# Patient Record
Sex: Male | Born: 1980 | Race: Black or African American | Hispanic: No | Marital: Married | Smoking: Current some day smoker
Health system: Southern US, Community
[De-identification: ages and names within clinical notes are randomized; demographics above are authoritative.]

---

## 2000-01-29 ENCOUNTER — Emergency Department (HOSPITAL_COMMUNITY): Admission: EM | Admit: 2000-01-29 | Discharge: 2000-01-29 | Payer: Self-pay

## 2000-08-30 ENCOUNTER — Encounter: Payer: Self-pay | Admitting: Emergency Medicine

## 2000-08-30 ENCOUNTER — Emergency Department (HOSPITAL_COMMUNITY): Admission: EM | Admit: 2000-08-30 | Discharge: 2000-08-30 | Payer: Self-pay | Admitting: Emergency Medicine

## 2000-09-09 ENCOUNTER — Emergency Department (HOSPITAL_COMMUNITY): Admission: EM | Admit: 2000-09-09 | Discharge: 2000-09-09 | Payer: Self-pay | Admitting: Emergency Medicine

## 2002-07-09 ENCOUNTER — Emergency Department (HOSPITAL_COMMUNITY): Admission: AD | Admit: 2002-07-09 | Discharge: 2002-07-09 | Payer: Self-pay | Admitting: Emergency Medicine

## 2002-07-09 ENCOUNTER — Emergency Department (HOSPITAL_COMMUNITY): Admission: EM | Admit: 2002-07-09 | Discharge: 2002-07-09 | Payer: Self-pay

## 2002-07-16 ENCOUNTER — Emergency Department (HOSPITAL_COMMUNITY): Admission: EM | Admit: 2002-07-16 | Discharge: 2002-07-16 | Payer: Self-pay | Admitting: Emergency Medicine

## 2002-08-03 ENCOUNTER — Emergency Department (HOSPITAL_COMMUNITY): Admission: EM | Admit: 2002-08-03 | Discharge: 2002-08-03 | Payer: Self-pay | Admitting: Emergency Medicine

## 2003-05-27 ENCOUNTER — Emergency Department (HOSPITAL_COMMUNITY): Admission: EM | Admit: 2003-05-27 | Discharge: 2003-05-27 | Payer: Self-pay | Admitting: Emergency Medicine

## 2003-06-01 ENCOUNTER — Emergency Department (HOSPITAL_COMMUNITY): Admission: EM | Admit: 2003-06-01 | Discharge: 2003-06-01 | Payer: Self-pay | Admitting: Family Medicine

## 2003-06-23 ENCOUNTER — Emergency Department (HOSPITAL_COMMUNITY): Admission: EM | Admit: 2003-06-23 | Discharge: 2003-06-23 | Payer: Self-pay | Admitting: Family Medicine

## 2004-06-01 ENCOUNTER — Emergency Department (HOSPITAL_COMMUNITY): Admission: EM | Admit: 2004-06-01 | Discharge: 2004-06-01 | Payer: Self-pay | Admitting: Emergency Medicine

## 2004-06-11 ENCOUNTER — Emergency Department (HOSPITAL_COMMUNITY): Admission: EM | Admit: 2004-06-11 | Discharge: 2004-06-11 | Payer: Self-pay | Admitting: Emergency Medicine

## 2004-11-30 ENCOUNTER — Emergency Department (HOSPITAL_COMMUNITY): Admission: EM | Admit: 2004-11-30 | Discharge: 2004-11-30 | Payer: Self-pay | Admitting: Emergency Medicine

## 2004-12-02 ENCOUNTER — Emergency Department (HOSPITAL_COMMUNITY): Admission: EM | Admit: 2004-12-02 | Discharge: 2004-12-02 | Payer: Self-pay | Admitting: Family Medicine

## 2004-12-04 ENCOUNTER — Emergency Department (HOSPITAL_COMMUNITY): Admission: EM | Admit: 2004-12-04 | Discharge: 2004-12-04 | Payer: Self-pay | Admitting: Family Medicine

## 2004-12-07 ENCOUNTER — Emergency Department (HOSPITAL_COMMUNITY): Admission: EM | Admit: 2004-12-07 | Discharge: 2004-12-07 | Payer: Self-pay | Admitting: Family Medicine

## 2005-07-29 ENCOUNTER — Emergency Department (HOSPITAL_COMMUNITY): Admission: EM | Admit: 2005-07-29 | Discharge: 2005-07-29 | Payer: Self-pay | Admitting: Family Medicine

## 2006-09-20 ENCOUNTER — Emergency Department (HOSPITAL_COMMUNITY): Admission: EM | Admit: 2006-09-20 | Discharge: 2006-09-20 | Payer: Self-pay | Admitting: Emergency Medicine

## 2007-07-19 ENCOUNTER — Emergency Department (HOSPITAL_COMMUNITY): Admission: EM | Admit: 2007-07-19 | Discharge: 2007-07-19 | Payer: Self-pay | Admitting: Family Medicine
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
## 2009-09-28 ENCOUNTER — Emergency Department (HOSPITAL_COMMUNITY): Admission: EM | Admit: 2009-09-28 | Discharge: 2009-09-28 | Payer: Self-pay | Admitting: Emergency Medicine

## 2010-10-04 LAB — URINE CULTURE
Colony Count: NO GROWTH
Culture: NO GROWTH

## 2010-10-04 LAB — HERPES SIMPLEX VIRUS CULTURE: Culture: DETECTED

## 2010-10-04 LAB — HEPATITIS B CORE ANTIBODY, TOTAL: Hep B Core Total Ab: NEGATIVE

## 2010-10-04 LAB — POCT URINALYSIS DIP (DEVICE)
Bilirubin Urine: NEGATIVE
Glucose, UA: NEGATIVE
Ketones, ur: NEGATIVE
Nitrite: NEGATIVE
Specific Gravity, Urine: 1.02
pH: 7

## 2010-10-04 LAB — HEPATITIS B SURFACE ANTIGEN: Hepatitis B Surface Ag: NEGATIVE

## 2010-10-04 LAB — GC/CHLAMYDIA PROBE AMP, GENITAL: Chlamydia, DNA Probe: NEGATIVE

## 2018-07-12 ENCOUNTER — Other Ambulatory Visit: Payer: Self-pay

## 2018-07-12 DIAGNOSIS — M5431 Sciatica, right side: Secondary | ICD-10-CM | POA: Diagnosis not present

## 2018-07-12 DIAGNOSIS — F17290 Nicotine dependence, other tobacco product, uncomplicated: Secondary | ICD-10-CM | POA: Insufficient documentation

## 2018-07-12 DIAGNOSIS — M545 Low back pain: Secondary | ICD-10-CM | POA: Diagnosis present

## 2018-07-13 ENCOUNTER — Emergency Department (HOSPITAL_COMMUNITY)
Admission: EM | Admit: 2018-07-13 | Discharge: 2018-07-13 | Disposition: A | Discharge status: Home / Self Care | Payer: BC Managed Care – PPO | Attending: Emergency Medicine | Admitting: Emergency Medicine

## 2018-07-13 ENCOUNTER — Encounter (HOSPITAL_COMMUNITY): Payer: Self-pay | Admitting: Emergency Medicine

## 2018-07-13 ENCOUNTER — Other Ambulatory Visit: Payer: Self-pay

## 2018-07-13 DIAGNOSIS — M5431 Sciatica, right side: Secondary | ICD-10-CM

## 2018-07-13 MED ORDER — METHOCARBAMOL 500 MG PO TABS
500.0000 mg | ORAL_TABLET | Freq: Once | ORAL | Status: AC
  Administered 2018-07-13: 500 mg via ORAL
  Filled 2018-07-13: qty 1

## 2018-07-13 MED ORDER — IBUPROFEN 800 MG PO TABS
800.0000 mg | ORAL_TABLET | Freq: Once | ORAL | Status: AC
  Administered 2018-07-13: 04:00:00 800 mg via ORAL
  Filled 2018-07-13: qty 1

## 2018-07-13 MED ORDER — IBUPROFEN 800 MG PO TABS: 800.0000 mg | ORAL_TABLET | Freq: Three times a day (TID) | ORAL | 0 refills | Status: DC | PRN

## 2018-07-13 MED ORDER — PREDNISONE 20 MG PO TABS
60.0000 mg | ORAL_TABLET | Freq: Once | ORAL | Status: AC
  Administered 2018-07-13: 04:00:00 60 mg via ORAL
  Filled 2018-07-13: qty 3

## 2018-07-13 MED ORDER — PREDNISONE 10 MG (21) PO TBPK: ORAL_TABLET | ORAL | 0 refills | Status: DC

## 2018-07-13 MED ORDER — METHOCARBAMOL 500 MG PO TABS: 500.0000 mg | ORAL_TABLET | Freq: Three times a day (TID) | ORAL | 0 refills | Status: DC | PRN
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
## 2018-07-13 NOTE — Discharge Instructions (Addendum)
You may alternate Tylenol 1000 mg every 6 hours as needed for pain and Ibuprofen 800 mg every 8 hours as needed for pain.  Please take Ibuprofen with food. ° °Steps to find a Primary Care Provider (PCP): ° °Call [phone] or [phone] to access "[facility] Find a Doctor Service." ° °2.  You may also go on the [facility] website at [email or web address] ° °3.  [facility] also frequently accepts new patients. ° °[facility]  °[address] °[place] [address] °[phone] ° °4.  There are also multiple [facility], [facility] and [facility] practices throughout the [place] that are frequently accepting new patients. You may find a clinic that is close to your home and contact them. ° °[facility] °[email or web address] °[phone] ° °[facility] °[email or web address] ° °[facility] °[email or web address] °[phone] ° °[facility] °[email or web address] °[phone] ° °5.  Local Health Departments also can provide primary care services. ° °[facility]  °[address] °[place] [address] [phone] ° °[facility] °[address] °[place] [address] °[phone] ° °[facility] °[address]  °[place] [address] °[phone] ° ° °

## 2019-08-18 ENCOUNTER — Ambulatory Visit
Admission: EM | Admit: 2019-08-18 | Discharge: 2019-08-18 | Disposition: A | Discharge status: Home / Self Care | Payer: Self-pay | Attending: Physician Assistant | Admitting: Physician Assistant

## 2019-08-18 DIAGNOSIS — Z20822 Contact with and (suspected) exposure to covid-19: Secondary | ICD-10-CM

## 2019-08-18 DIAGNOSIS — Z1152 Encounter for screening for COVID-19: Secondary | ICD-10-CM

## 2019-08-18 MED ORDER — BENZONATATE 200 MG PO CAPS: 200.0000 mg | ORAL_CAPSULE | Freq: Three times a day (TID) | ORAL | 0 refills | Status: DC

## 2019-08-18 MED ORDER — FLUTICASONE PROPIONATE 50 MCG/ACT NA SUSP: 2.0000 | Freq: Every day | NASAL | 0 refills | Status: DC
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
## 2019-08-18 NOTE — Discharge Instructions (Signed)
COVID PCR testing ordered. As discussed, given your exposure and symptoms, I would like you to quarantine as if you are positive for COVID regardless of symptoms.  You can discontinue quarantine after 10 days of symptom onset AND 24 hours of no fever with improvement of symptoms.   Tessalon for cough. Flonase for sinus pressure/nasal congestion. Tylenol/motrin for pain and fever. Keep hydrated, urine should be clear to pale yellow in color. If experiencing shortness of breath, trouble breathing, go to the emergency department for further evaluation needed.

## 2019-08-18 NOTE — ED Provider Notes (Signed)
[facility]    CSN: [id number] Arrival date & time: 08/18/19  1752      History   Chief Complaint Chief Complaint  Patient presents with  . Generalized Body Aches  . Shortness of Breath    HPI Craig Lara. is a 39 y.o. male.   39 year old male comes in for 4 day of URI symptoms. Cough, body aches, subjective fever, chills, loss of taste. Short of breath intermittently with mild dyspnea on exertion.  Denies abdominal pain, nausea, vomiting, diarrhea. Positive COVID exposure.     History reviewed. No pertinent past medical history.  There are no problems to display for this patient.   History reviewed. No pertinent surgical history.     Home Medications    Prior to Admission medications   Medication Sig Start Date End Date Taking? Authorizing Provider  benzonatate (TESSALON) 200 MG capsule Take 1 capsule (200 mg total) by mouth every 8 (eight) hours. 08/18/19   Cathie Hoops, Aziel Morgan V, PA-C  fluticasone (FLONASE) 50 MCG/ACT nasal spray Place 2 sprays into both nostrils daily. 08/18/19   Cathie Hoops, Dashea Mcmullan V, PA-C  ibuprofen (ADVIL) 800 MG tablet Take 1 tablet (800 mg total) by mouth every 8 (eight) hours as needed for mild pain. 07/13/18   Ward, Layla Maw, DO    Family History Family History  Problem Relation Age of Onset  . Healthy Mother   . Healthy Father     Social History Social History   Tobacco Use  . Smoking status: Current Some Day Smoker    Types: Cigars  . Smokeless tobacco: Never Used  Substance Use Topics  . Alcohol use: Yes    Comment: occ  . Drug use: Never     Allergies   Patient has no known allergies.   Review of Systems Review of Systems  Reason unable to perform ROS: See HPI as above.     Physical Exam Triage Vital Signs ED Triage Vitals  Enc Vitals Group     BP 08/18/19 1817 118/80     Pulse Rate 08/18/19 1817 91     Resp 08/18/19 1817 18     Temp 08/18/19 1817 98.6 F (37 C)     Temp Source 08/18/19 1817 Oral      SpO2 08/18/19 1817 98 %     Weight --      Height --      Head Circumference --      Peak Flow --      Pain Score 08/18/19 1846 0     Pain Loc --      Pain Edu? --      Excl. in GC? --    No data found.  Updated Vital Signs BP 118/80 (BP Location: Right Arm)   Pulse 91   Temp 98.6 F (37 C) (Oral)   Resp 18   SpO2 98%   Physical Exam Constitutional:      General: He is not in acute distress.    Appearance: Normal appearance. He is not ill-appearing, toxic-appearing or diaphoretic.  HENT:     Head: Normocephalic and atraumatic.     Nose:     Right Sinus: Frontal sinus tenderness present. No maxillary sinus tenderness.     Left Sinus: Frontal sinus tenderness present. No maxillary sinus tenderness.     Mouth/Throat:     Mouth: Mucous membranes are moist.     Pharynx: Oropharynx is clear. Uvula midline.  Cardiovascular:  Rate and Rhythm: Normal rate and regular rhythm.     Heart sounds: Normal heart sounds. No murmur heard.  No friction rub. No gallop.   Pulmonary:     Effort: Pulmonary effort is normal. No accessory muscle usage, prolonged expiration, respiratory distress or retractions.     Comments: Lungs clear to auscultation without adventitious lung sounds. Musculoskeletal:     Cervical back: Normal range of motion and neck supple.  Neurological:     General: No focal deficit present.     Mental Status: He is alert and oriented to person, place, and time.      UC Treatments / Results  Labs (all labs ordered are listed, but only abnormal results are displayed) Labs Reviewed  NOVEL CORONAVIRUS, NAA    EKG   Radiology No results found.  Procedures Procedures (including critical care time)  Medications Ordered in UC Medications - No data to display  Initial Impression / Assessment and Plan / UC Course  I have reviewed the triage vital signs and the nursing notes.  Pertinent labs & imaging results that were available during my care of the patient  were reviewed by me and considered in my medical decision making (see chart for details).    COVID PCR test ordered. However, given patient's exposure/symptoms, suspect COVID and will have patient follow quarantine instructions for COVID. No alarming signs on exam.  Patient speaking in full sentences without respiratory distress. O2 sat 98%. LCTAB. Symptomatic treatment discussed.  Push fluids.  Return precautions given.  Patient expresses understanding and agrees to plan.  Final Clinical Impressions(s) / UC Diagnoses   Final diagnoses:  Encounter for screening for COVID-19  Suspected COVID-19 virus infection    ED Prescriptions    Medication Sig Dispense Auth. Provider   benzonatate (TESSALON) 200 MG capsule Take 1 capsule (200 mg total) by mouth every 8 (eight) hours. 21 capsule Buel Molder V, PA-C   fluticasone (FLONASE) 50 MCG/ACT nasal spray Place 2 sprays into both nostrils daily. 1 g Belinda Fisher, PA-C     PDMP not reviewed this encounter.   Belinda Fisher, PA-C 08/18/19 1916

## 2019-08-20 LAB — SARS-COV-2, NAA 2 DAY TAT

## 2019-08-20 LAB — NOVEL CORONAVIRUS, NAA: SARS-CoV-2, NAA: DETECTED — AB

## 2021-09-01 ENCOUNTER — Other Ambulatory Visit: Payer: Self-pay | Admitting: Pharmacist

## 2021-09-01 NOTE — Progress Notes (Signed)
Community Blood Pressure Screening Event  Patient seen for blood pressure screening event. Declines second check. Reports he has been doing exercise today and drinking alcohol. Declines second BP check and denies any questions today.   Catie Eppie Gibson, PharmD, [facility] [phone]

## 2023-10-08 ENCOUNTER — Ambulatory Visit (HOSPITAL_COMMUNITY)
Admission: EM | Admit: 2023-10-08 | Discharge: 2023-10-08 | Disposition: A | Discharge status: Home / Self Care | Attending: Family Medicine | Admitting: Family Medicine

## 2023-10-08 ENCOUNTER — Encounter (HOSPITAL_COMMUNITY): Payer: Self-pay

## 2023-10-08 DIAGNOSIS — L509 Urticaria, unspecified: Secondary | ICD-10-CM | POA: Diagnosis not present

## 2023-10-08 DIAGNOSIS — B360 Pityriasis versicolor: Secondary | ICD-10-CM

## 2023-10-08 MED ORDER — METHYLPREDNISOLONE 4 MG PO TBPK: ORAL_TABLET | ORAL | 0 refills | Status: AC

## 2023-10-08 MED ORDER — KETOCONAZOLE 2 % EX SHAM: MEDICATED_SHAMPOO | CUTANEOUS | 0 refills | Status: AC

## 2023-10-08 NOTE — Medical Student Note (Signed)
 [facility] [employer] account manager Note For educational purposes for Medical, PA and NP students only and not part of the legal medical record.   CSN: [id number] Arrival date & time: 10/08/23  1727      History   Chief Complaint Chief Complaint  Patient presents with   Rash    HPI Craig Lara. is a 43 y.o. male.  Pt with a hx of tinea infection presents with a diffuse maculopapular rash on trunk and extremities. It started on his chest 3 days ago and then became diffuse. It is pruritic. Pt has an image that shows what appears to be more like hives on his back initially. He has recently changed the scent of his laundry detergent.   Additionally, he is has had a flat rash on his shoulders for several months, that he states is similar to a fungal infection he has had in the past that was treated with ketoconazole shampoo. He denies any recent illness, fevers, or other signs of infection.   The history is provided by the patient.  Rash Associated symptoms: no fever     History reviewed. No pertinent past medical history.  There are no active problems to display for this patient.   History reviewed. No pertinent surgical history.     Home Medications    Prior to Admission medications   Not on File    Family History Family History  Problem Relation Age of Onset   Healthy Mother    Healthy Father     Social History Social History   Tobacco Use   Smoking status: Some Days    Types: Cigars   Smokeless tobacco: Never  Substance Use Topics   Alcohol use: Yes    Comment: occ   Drug use: Never     Allergies   Patient has no known allergies.   Review of Systems Review of Systems  Constitutional:  Negative for chills and fever.  HENT:  Negative for congestion, mouth sores and rhinorrhea.   Respiratory:  Negative for cough.   Skin:  Positive for rash.     Physical Exam Updated Vital Signs BP 138/84 (BP Location: Left Arm)   Pulse 86    Temp 98.3 F (36.8 C) (Oral)   Resp 16   SpO2 99%   Physical Exam Constitutional:      Appearance: He is not ill-appearing.  Cardiovascular:     Rate and Rhythm: Normal rate and regular rhythm.     Pulses: Normal pulses.     Heart sounds: Normal heart sounds.  Pulmonary:     Effort: Pulmonary effort is normal.     Breath sounds: Normal breath sounds.  Musculoskeletal:     Cervical back: Neck supple.  Lymphadenopathy:     Cervical: No cervical adenopathy.  Skin:    Findings: Rash (macular, hypopigmented lesions with defined borders on bilateral shoulders. Maculopapular, erthemic rash diffuse on torso and extremities present without crusting, drainage, or pitting noted.) present.  Neurological:     Mental Status: He is alert.      ED Treatments / Results  Labs (all labs ordered are listed, but only abnormal results are displayed) Labs Reviewed - No data to display  EKG  Radiology No results found.  Procedures Procedures (including critical care time)  Medications Ordered in ED Medications - No data to display   Initial Impression / Assessment and Plan / ED Course  I have reviewed the triage vital signs and the nursing  notes.  Pertinent labs & imaging results that were available during my care of the patient were reviewed by me and considered in my medical decision making (see chart for details).     Tinea Versicolor. Macular rash on the shoulder consistent with tinea infection. Will start ketoconazole 2% shampoo applied topically 2-3 times per week to affected area until symptoms resolve.   Allergic Dermatitis. Diffuse maculopapular rash is likely allergic dermatitis. Will treat with Medrol Dosepak taper   Red flag symptoms reviewed and return precautions given.   Final Clinical Impressions(s) / ED Diagnoses   Final diagnoses:  None    New Prescriptions New Prescriptions   No medications on file

## 2023-10-08 NOTE — ED Triage Notes (Signed)
 Patient here today with c/o a itchy rash on torso X 3 days. No known cause. He used a itch cream for fungus with no relief.

## 2023-10-11 NOTE — ED Provider Notes (Signed)
 [facility]   [id number] 10/08/23 Arrival Time: 1727  ASSESSMENT & PLAN:  1. Hives   2. Tinea versicolor    Begin: Meds ordered this encounter  Medications   methylPREDNISolone (MEDROL DOSEPAK) 4 MG TBPK tablet    Sig: Take as directed.    Dispense:  1 each    Refill:  0   ketoconazole (NIZORAL) 2 % shampoo    Sig: Apply 2% shampoo TOPICALLY 2-3 times per week to damp skin and a wide surrounding margin. Lather and leave on skin for 5 minutes then rinse.    Dispense:  120 mL    Refill:  0    Will follow up with PCP or here if worsening or failing to improve as anticipated. Reviewed expectations re: course of current medical issues. Questions answered. Outlined signs and symptoms indicating need for more acute intervention. Patient verbalized understanding. After Visit Summary given.   SUBJECTIVE:  Craig Lara. is a 43 y.o. male who presents with a skin complaint. Pt with a hx of tinea infection presents with a diffuse maculopapular rash on trunk and extremities. It started on his chest 3 days ago and then became diffuse. It is pruritic. Pt has an image that shows what appears to be more like hives on his back initially. He has recently changed the scent of his laundry detergent.    Additionally, he is has had a flat rash on his shoulders for several months, that he states is similar to a fungal infection he has had in the past that was treated with ketoconazole shampoo. He denies any recent illness, fevers, or other signs of infection.    OBJECTIVE: Vitals:   10/08/23 1900  BP: 138/84  Pulse: 86  Resp: 16  Temp: 98.3 F (36.8 C)  TempSrc: Oral  SpO2: 99%    General appearance: alert; no distress HEENT: [place]; AT Extremities: no edema; moves all extremities normally Skin: warm and dry; Rash (macular, hypopigmented lesions with defined borders on bilateral shoulders. Maculopapular, erthemic rash diffuse on torso and extremities present without  crusting, drainage, or pitting noted.) present.  Psychological: alert and cooperative; normal mood and affect  No Known Allergies  History reviewed. No pertinent past medical history. Social History   Socioeconomic History   Marital status: Married    Spouse name: Not on file   Number of children: Not on file   Years of education: Not on file   Highest education level: Not on file  Occupational History   Not on file  Tobacco Use   Smoking status: Some Days    Types: Cigars   Smokeless tobacco: Never  Substance and Sexual Activity   Alcohol use: Yes    Comment: occ   Drug use: Never   Sexual activity: Yes  Other Topics Concern   Not on file  Social History Narrative   Not on file   Social Drivers of Health   Financial Resource Strain: Not on file  Food Insecurity: No Food Insecurity (07/18/2021)   Received from [facility]   Hunger Vital Sign    Within the past 12 months, you worried that your food would run out before you got the money to buy more.: Never true    Within the past 12 months, the food you bought just didn't last and you didn't have money to get more.: Never true  Transportation Needs: Not on file  Physical Activity: Not on file  Stress: Not on file  Social Connections: Unknown (  05/06/2021)   Received from [facility]   Social Network    Social Network: Not on file  Intimate Partner Violence: Unknown (04/09/2021)   Received from [facility]   HITS    Physically Hurt: Not on file    Insult or Talk Down To: Not on file    Threaten Physical Harm: Not on file    Scream or Curse: Not on file   Family History  Problem Relation Age of Onset   Healthy Mother    Healthy Father    History reviewed. No pertinent surgical history.    Rolinda Rogue, MD 10/11/23 [phone]
